# Patient Record
Sex: Male | Born: 1955
Health system: Southern US, Community
[De-identification: ages and names within clinical notes are randomized; demographics above are authoritative.]

## PROBLEM LIST (undated history)

## (undated) DIAGNOSIS — K219 Gastro-esophageal reflux disease without esophagitis: Secondary | ICD-10-CM

---

## 2012-09-21 ENCOUNTER — Emergency Department (HOSPITAL_COMMUNITY): Payer: 59

## 2012-09-21 ENCOUNTER — Encounter (HOSPITAL_COMMUNITY): Payer: Self-pay | Admitting: Emergency Medicine

## 2012-09-21 ENCOUNTER — Emergency Department (HOSPITAL_COMMUNITY)
Admission: EM | Admit: 2012-09-21 | Discharge: 2012-09-21 | Disposition: A | Discharge status: Home / Self Care | Payer: 59 | Attending: Emergency Medicine | Admitting: Emergency Medicine

## 2012-09-21 DIAGNOSIS — N200 Calculus of kidney: Secondary | ICD-10-CM | POA: Insufficient documentation

## 2012-09-21 DIAGNOSIS — K8020 Calculus of gallbladder without cholecystitis without obstruction: Secondary | ICD-10-CM | POA: Insufficient documentation

## 2012-09-21 DIAGNOSIS — K8050 Calculus of bile duct without cholangitis or cholecystitis without obstruction: Secondary | ICD-10-CM

## 2012-09-21 LAB — CBC WITH DIFFERENTIAL/PLATELET
Basophils Absolute: 0.1 10*3/uL (ref 0.0–0.1)
Eosinophils Relative: 3 % (ref 0–5)
HCT: 46.8 % (ref 39.0–52.0)
Lymphocytes Relative: 32 % (ref 12–46)
MCV: 91.1 fL (ref 78.0–100.0)
Monocytes Absolute: 0.8 10*3/uL (ref 0.1–1.0)
Monocytes Relative: 10 % (ref 3–12)
RDW: 12.1 % (ref 11.5–15.5)
WBC: 7.9 10*3/uL (ref 4.0–10.5)

## 2012-09-21 LAB — POCT I-STAT TROPONIN I: Troponin i, poc: 0.01 ng/mL (ref 0.00–0.08)

## 2012-09-21 LAB — COMPREHENSIVE METABOLIC PANEL
AST: 25 U/L (ref 0–37)
BUN: 10 mg/dL (ref 6–23)
CO2: 25 mEq/L (ref 19–32)
Calcium: 9.7 mg/dL (ref 8.4–10.5)
Creatinine, Ser: 0.89 mg/dL (ref 0.50–1.35)
GFR calc Af Amer: >90 mL/min (ref >90)
GFR calc non Af Amer: >90 mL/min (ref >90)
Glucose, Bld: 100 mg/dL — ABNORMAL HIGH (ref 70–99)

## 2012-09-21 LAB — D-DIMER, QUANTITATIVE: D-Dimer, Quant: <0.27 ug/mL-FEU (ref 0.00–0.48)

## 2012-09-21 NOTE — ED Notes (Signed)
Pt discharged.Vital signs stable and GCS 15 

## 2012-09-21 NOTE — ED Provider Notes (Signed)
Medical screening examination/treatment/procedure(s) were performed by non-physician practitioner and as supervising physician I was immediately available for consultation/collaboration.  Jasmine Awe, MD 09/21/12 [phone]

## 2012-09-21 NOTE — ED Provider Notes (Signed)
History     CSN: [id number]  Arrival date & time 09/21/12  0123   First MD Initiated Contact with Patient 09/21/12 0226      Chief Complaint  Patient presents with  . Chest Pain    (Consider location/radiation/quality/duration/timing/severity/associated sxs/prior treatment) HPI Comments: Patient is with a leg, down in bed tonight.  He was having some light chest pain.  That was worse when he was lying back.  He states it feels, like he can't get a full lung worth of pain.  Denies any nausea, recent trips has persistent chronic swelling in his right leg.  Due to previous vein stripping years ago.  Patient states he ate a fatty hamburger for dinner and this pain started several hours later.  Has no previous history of gallbladder disease has a remote history of a rib fracture on the right.  Has not taken any over-the-counter medication on a daily basis.  He did take ibuprofen prior to arrival.  Is not requesting any pain control at this time  Patient is a 56 y.o. male presenting with chest pain. The history is provided by the patient.  Chest Pain The chest pain began 3 - 5 hours ago. Chest pain occurs constantly. The chest pain is unchanged. The pain is associated with breathing. At its most intense, the pain is at 5/10. The quality of the pain is described as aching. The pain does not radiate. Pertinent negatives for primary symptoms include no fever, no cough, no abdominal pain, no nausea, no vomiting and no dizziness.  Pertinent negatives for associated symptoms include no numbness and no weakness.     History reviewed. No pertinent past medical history.  History reviewed. No pertinent past surgical history.  No family history on file.  History  Substance Use Topics  . Smoking status: Never Smoker   . Smokeless tobacco: Not on file  . Alcohol Use: Yes      Review of Systems  Constitutional: Negative for fever and chills.  HENT: Negative for congestion, rhinorrhea and  trouble swallowing.   Respiratory: Negative for cough.   Cardiovascular: Positive for chest pain.  Gastrointestinal: Negative for nausea, vomiting and abdominal pain.  Genitourinary: Negative for dysuria and urgency.  Neurological: Negative for dizziness, weakness and numbness.    Allergies  Codeine  Home Medications   Current Outpatient Rx  Name  Route  Sig  Dispense  Refill  . IBUPROFEN-DIPHENHYDRAMINE HCL 200-25 MG PO CAPS   Oral   Take 2 capsules by mouth at bedtime as needed. For sleep           BP 174/99  Pulse 80  Temp 97.1 F (36.2 C) (Oral)  Resp 16  SpO2 95%  Physical Exam  Nursing note and vitals reviewed. Constitutional: He appears well-developed and well-nourished.  HENT:  Head: Normocephalic.  Eyes: Pupils are equal, round, and reactive to light.  Neck: Normal range of motion.  Cardiovascular: Normal rate and regular rhythm.   Pulmonary/Chest: Effort normal. No respiratory distress. He exhibits tenderness.  Abdominal: Soft. Bowel sounds are normal. There is no splenomegaly or hepatomegaly. There is tenderness in the right upper quadrant. There is no CVA tenderness.      ED Course  Procedures (including critical care time)   Labs Reviewed  CBC WITH DIFFERENTIAL  POCT I-STAT TROPONIN I  COMPREHENSIVE METABOLIC PANEL   Dg Chest 2 View  09/21/2012  *RADIOLOGY REPORT*  Clinical Data: Right-sided chest pain.  CHEST - 2 VIEW  Comparison: None.  Findings: The heart size and pulmonary vascularity are normal. The lungs appear clear and expanded without focal air space disease or consolidation. No blunting of the costophrenic angles.  No pneumothorax.  Mediastinal contours appear intact.  Degenerative changes in the thoracic spine.  IMPRESSION: No evidence of active pulmonary disease.   Original Report Authenticated By: Burman Nieves, M.D.      No diagnosis found.   ED ECG REPORT   Date: 09/21/2012  EKG Time: 6:08 AM  Rate: 71  Rhythm: normal  sinus rhythm,  unchanged from previous tracings  Axis: normal  Intervals:none  ST&T Change: none  Narrative Interpretation: normal            MDM   I discussed lab results.  Ultrasound, results EKG, with patient will obtain a second cardiac marker.  If this is again, negative.  Will discharge patient home with a diagnosis of biliary colic and right kidney stone        Arman Filter, NP 09/21/12 0602  Arman Filter, NP 09/21/12 [phone]

## 2012-09-21 NOTE — ED Notes (Signed)
Pt presented to ED with pain in the rt side and SOB.

## 2012-09-21 NOTE — ED Notes (Signed)
PT. REPORTS RIGHT CHEST AND RIGHT UPPER ABDOMINAL PAIN WITH SOB WHEN LYING ON BED THIS EVENING , DENIES NAUSEA OR DIAPHORESIS , TOOK ADVIL FOR PAIN WITH NO RELIEF.

## 2013-07-05 ENCOUNTER — Other Ambulatory Visit: Payer: Self-pay | Admitting: Internal Medicine

## 2013-07-05 DIAGNOSIS — R1013 Epigastric pain: Secondary | ICD-10-CM

## 2013-07-05 DIAGNOSIS — K219 Gastro-esophageal reflux disease without esophagitis: Secondary | ICD-10-CM

## 2013-07-10 ENCOUNTER — Emergency Department (HOSPITAL_COMMUNITY): Payer: 59

## 2013-07-10 ENCOUNTER — Emergency Department (HOSPITAL_COMMUNITY)
Admission: EM | Admit: 2013-07-10 | Discharge: 2013-07-11 | Disposition: A | Discharge status: Home / Self Care | Payer: 59 | Attending: Emergency Medicine | Admitting: Emergency Medicine

## 2013-07-10 ENCOUNTER — Encounter (HOSPITAL_COMMUNITY): Payer: Self-pay | Admitting: *Deleted

## 2013-07-10 DIAGNOSIS — F411 Generalized anxiety disorder: Secondary | ICD-10-CM | POA: Insufficient documentation

## 2013-07-10 DIAGNOSIS — R1011 Right upper quadrant pain: Secondary | ICD-10-CM | POA: Insufficient documentation

## 2013-07-10 DIAGNOSIS — R0781 Pleurodynia: Secondary | ICD-10-CM

## 2013-07-10 DIAGNOSIS — M546 Pain in thoracic spine: Secondary | ICD-10-CM | POA: Insufficient documentation

## 2013-07-10 DIAGNOSIS — R0789 Other chest pain: Secondary | ICD-10-CM | POA: Insufficient documentation

## 2013-07-10 DIAGNOSIS — R61 Generalized hyperhidrosis: Secondary | ICD-10-CM | POA: Insufficient documentation

## 2013-07-10 DIAGNOSIS — Z8719 Personal history of other diseases of the digestive system: Secondary | ICD-10-CM | POA: Insufficient documentation

## 2013-07-10 HISTORY — DX: Gastro-esophageal reflux disease without esophagitis: K21.9

## 2013-07-10 LAB — D-DIMER, QUANTITATIVE: D-Dimer, Quant: <0.27 ug/mL-FEU (ref 0.00–0.48)

## 2013-07-10 LAB — CBC WITH DIFFERENTIAL/PLATELET
Basophils Absolute: 0 10*3/uL (ref 0.0–0.1)
Eosinophils Absolute: 0 10*3/uL (ref 0.0–0.7)
Eosinophils Relative: 0 % (ref 0–5)
MCH: 31.6 pg (ref 26.0–34.0)
MCHC: 34.8 g/dL (ref 30.0–36.0)
MCV: 90.8 fL (ref 78.0–100.0)
Monocytes Absolute: 0.7 10*3/uL (ref 0.1–1.0)
Platelets: 167 10*3/uL (ref 150–400)
RDW: 12 % (ref 11.5–15.5)

## 2013-07-10 LAB — COMPREHENSIVE METABOLIC PANEL
ALT: 23 U/L (ref 0–53)
AST: 28 U/L (ref 0–37)
Calcium: 9.2 mg/dL (ref 8.4–10.5)
Creatinine, Ser: 1.04 mg/dL (ref 0.50–1.35)
GFR calc Af Amer: >90 mL/min (ref >90)
Glucose, Bld: 122 mg/dL — ABNORMAL HIGH (ref 70–99)
Sodium: 140 mEq/L (ref 135–145)
Total Protein: 7.2 g/dL (ref 6.0–8.3)

## 2013-07-10 MED ORDER — IBUPROFEN 600 MG PO TABS: 600.0000 mg | ORAL_TABLET | Freq: Three times a day (TID) | ORAL | Status: AC

## 2013-07-10 NOTE — ED Notes (Signed)
MD at bedside. 

## 2013-07-10 NOTE — ED Notes (Signed)
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 Pain starts underneath his ribs, radiates to back. Pt states he feels like he can't get a deep breath in. Pt reports pain for the past three or four weeks. Pt states pain increases with deep inspiration. Pt is A&Ox4, respirations equal and unlabored, skin warm and dry.

## 2013-07-10 NOTE — ED Notes (Signed)
Family at bedside. 

## 2013-07-10 NOTE — ED Provider Notes (Signed)
CSN: [id number]     Arrival date & time 07/10/13  2027 History   First MD Initiated Contact with Patient 07/10/13 2029     Chief Complaint  Patient presents with  . Shortness of Breath  . Abdominal Pain   (Consider location/radiation/quality/duration/timing/severity/associated sxs/prior Treatment) HPI Patient presents with right upper quadrant pain that radiates to his right thoracic back. This pain has been intermittent for quite some time. He was seen 2 years ago in emergency department for similar complaints. The pain is worse after eating. The pain is worse with deep breathing. States he feels he can't take a deep breath in. He's had no recent trauma to the chest or increased heavy lifting. He said no coughing. He denies any nausea, vomiting or diarrhea. He was told the past he had sludge in his gallbladder. The pain worsened tonight. Patient became diaphoretic and his blood pressure was taken and found to be elevated. Patient continues to have a right upper quadrant right chest pain that radiates to his back. No recent lower extremity swelling or pain. Past Medical History  Diagnosis Date  . GERD (gastroesophageal reflux disease)    History reviewed. No pertinent past surgical history. History reviewed. No pertinent family history. History  Substance Use Topics  . Smoking status: Never Smoker   . Smokeless tobacco: Not on file  . Alcohol Use: Yes    Review of Systems  Constitutional: Positive for diaphoresis. Negative for fever and chills.  Respiratory: Positive for shortness of breath. Negative for cough and wheezing.   Cardiovascular: Positive for chest pain. Negative for palpitations and leg swelling.  Gastrointestinal: Positive for abdominal pain. Negative for nausea, vomiting, diarrhea and constipation.  Genitourinary: Negative for dysuria and flank pain.  Musculoskeletal: Positive for back pain. Negative for myalgias.  Skin: Negative for rash and wound.  Neurological:  Negative for dizziness, weakness, light-headedness, numbness and headaches.  All other systems reviewed and are negative.    Allergies  Codeine  Home Medications   Current Outpatient Rx  Name  Route  Sig  Dispense  Refill  . Ibuprofen-Diphenhydramine HCl (ADVIL PM) 200-25 MG CAPS   Oral   Take 2 capsules by mouth at bedtime as needed. For sleep          BP 148/107  Pulse 71  Temp(Src) 97.6 F (36.4 C) (Oral)  Resp 15  Ht 6' (1.829 m)  Wt 202 lb (91.627 kg)  BMI 27.39 kg/m2  SpO2 100% Physical Exam  Nursing note and vitals reviewed. Constitutional: He is oriented to person, place, and time. He appears well-developed and well-nourished. No distress.  Anxious appearing  HENT:  Head: Normocephalic and atraumatic.  Mouth/Throat: Oropharynx is clear and moist. No oropharyngeal exudate.  Eyes: EOM are normal. Pupils are equal, round, and reactive to light.  Neck: Normal range of motion. Neck supple.  Cardiovascular: Normal rate and regular rhythm.  Exam reveals no gallop and no friction rub.   No murmur heard. Pulmonary/Chest: Effort normal and breath sounds normal. No respiratory distress. He has no wheezes. He has no rales. He exhibits no tenderness.  Abdominal: Soft. Bowel sounds are normal. He exhibits no distension and no mass. There is tenderness (multilayers to palpation in his right upper quadrant). There is no rebound and no guarding.  Musculoskeletal: Normal range of motion. He exhibits no edema and no tenderness.  No calf swelling or tenderness.  Neurological: He is alert and oriented to person, place, and time.  Patient is alert and oriented  x3 with clear, goal oriented speech. Patient has 5/5 motor in all extremities. Sensation is intact to light touch.    Skin: Skin is warm and dry. No rash noted. No erythema.  Psychiatric: He has a normal mood and affect. His behavior is normal.    ED Course  Procedures (including critical care time) Labs Review Labs  Reviewed  CBC WITH DIFFERENTIAL  COMPREHENSIVE METABOLIC PANEL  TROPONIN I  LIPASE, BLOOD  URINALYSIS, ROUTINE W REFLEX MICROSCOPIC  D-DIMER, QUANTITATIVE   Imaging Review No results found.  Date: 07/10/2013  Rate:72  Rhythm: normal sinus rhythm  QRS Axis: normal  Intervals: normal  ST/T Wave abnormalities: normal  Conduction Disutrbances:none  Narrative Interpretation:   Old EKG Reviewed: When compared with prior EKGs no significant changes are seen   MDM   Patient's symptoms have improved. His vital signs remained stable. His labs are normal. His ocean shows a normal gallbladder. The symptoms are likely due to pleurodynia. Flow started anti-inflammatory medication. Patient is advised to return for worsening shortness of breath, chest pain, fevers or chills, any concerns.  Loren Racer, MD 07/14/13 [phone]

## 2013-07-10 NOTE — ED Notes (Signed)
Lab at bedside

## 2013-07-14 ENCOUNTER — Other Ambulatory Visit: Payer: Self-pay | Admitting: Internal Medicine

## 2013-07-14 ENCOUNTER — Ambulatory Visit
Admission: RE | Admit: 2013-07-14 | Discharge: 2013-07-14 | Disposition: A | Discharge status: Home / Self Care | Payer: 59 | Source: Ambulatory Visit | Attending: Internal Medicine | Admitting: Internal Medicine

## 2013-07-14 DIAGNOSIS — K219 Gastro-esophageal reflux disease without esophagitis: Secondary | ICD-10-CM

## 2013-07-14 DIAGNOSIS — R1013 Epigastric pain: Secondary | ICD-10-CM

## 2015-05-21 ENCOUNTER — Other Ambulatory Visit: Payer: Self-pay | Admitting: Occupational Medicine

## 2015-05-21 ENCOUNTER — Ambulatory Visit
Admission: RE | Admit: 2015-05-21 | Discharge: 2015-05-21 | Disposition: A | Discharge status: Home / Self Care | Payer: No Typology Code available for payment source | Source: Ambulatory Visit | Attending: Occupational Medicine | Admitting: Occupational Medicine

## 2015-05-21 DIAGNOSIS — Z139 Encounter for screening, unspecified: Secondary | ICD-10-CM

## 2018-01-20 ENCOUNTER — Other Ambulatory Visit: Payer: Self-pay | Admitting: Internal Medicine

## 2018-01-20 DIAGNOSIS — K4090 Unilateral inguinal hernia, without obstruction or gangrene, not specified as recurrent: Secondary | ICD-10-CM | POA: Diagnosis not present

## 2018-01-20 DIAGNOSIS — R109 Unspecified abdominal pain: Secondary | ICD-10-CM | POA: Diagnosis not present

## 2018-01-27 ENCOUNTER — Ambulatory Visit
Admission: RE | Admit: 2018-01-27 | Discharge: 2018-01-27 | Disposition: A | Discharge status: Home / Self Care | Payer: 59 | Source: Ambulatory Visit | Attending: Internal Medicine | Admitting: Internal Medicine

## 2018-01-27 DIAGNOSIS — R109 Unspecified abdominal pain: Secondary | ICD-10-CM

## 2018-01-27 DIAGNOSIS — R1011 Right upper quadrant pain: Secondary | ICD-10-CM | POA: Diagnosis not present

## 2018-01-27 DIAGNOSIS — R10A0 Flank pain, unspecified side: Secondary | ICD-10-CM

## 2018-09-27 ENCOUNTER — Emergency Department (HOSPITAL_COMMUNITY)
Admission: EM | Admit: 2018-09-27 | Discharge: 2018-09-28 | Disposition: A | Discharge status: Home / Self Care | Payer: 59 | Attending: Emergency Medicine | Admitting: Emergency Medicine

## 2018-09-27 ENCOUNTER — Encounter (HOSPITAL_COMMUNITY): Payer: Self-pay

## 2018-09-27 ENCOUNTER — Emergency Department (HOSPITAL_COMMUNITY): Payer: 59

## 2018-09-27 ENCOUNTER — Other Ambulatory Visit: Payer: Self-pay

## 2018-09-27 DIAGNOSIS — R29818 Other symptoms and signs involving the nervous system: Secondary | ICD-10-CM | POA: Diagnosis not present

## 2018-09-27 DIAGNOSIS — R079 Chest pain, unspecified: Secondary | ICD-10-CM | POA: Diagnosis not present

## 2018-09-27 DIAGNOSIS — R42 Dizziness and giddiness: Secondary | ICD-10-CM | POA: Diagnosis not present

## 2018-09-27 DIAGNOSIS — I10 Essential (primary) hypertension: Secondary | ICD-10-CM | POA: Diagnosis not present

## 2018-09-27 LAB — DIFFERENTIAL
Abs Immature Granulocytes: 0.03 10*3/uL (ref 0.00–0.07)
BASOS ABS: 0.1 10*3/uL (ref 0.0–0.1)
Basophils Relative: 1 %
Eosinophils Absolute: 0.1 10*3/uL (ref 0.0–0.5)
Eosinophils Relative: 2 %
Immature Granulocytes: 0 %
Lymphocytes Relative: 19 %
Lymphs Abs: 1.5 10*3/uL (ref 0.7–4.0)
Monocytes Absolute: 0.7 10*3/uL (ref 0.1–1.0)
Monocytes Relative: 8 %
Neutro Abs: 5.5 10*3/uL (ref 1.7–7.7)
Neutrophils Relative %: 70 %

## 2018-09-27 LAB — CBC
HCT: 46.8 % (ref 39.0–52.0)
HEMOGLOBIN: 15.4 g/dL (ref 13.0–17.0)
MCH: 31.3 pg (ref 26.0–34.0)
MCHC: 32.9 g/dL (ref 30.0–36.0)
MCV: 95.1 fL (ref 80.0–100.0)
Platelets: 175 10*3/uL (ref 150–400)
RBC: 4.92 MIL/uL (ref 4.22–5.81)
RDW: 11.6 % (ref 11.5–15.5)
WBC: 7.9 10*3/uL (ref 4.0–10.5)
nRBC: 0 % (ref 0.0–0.2)

## 2018-09-27 LAB — COMPREHENSIVE METABOLIC PANEL
ALT: 37 U/L (ref 0–44)
AST: 32 U/L (ref 15–41)
Albumin: 4.3 g/dL (ref 3.5–5.0)
Alkaline Phosphatase: 40 U/L (ref 38–126)
Anion gap: 11 (ref 5–15)
BUN: 11 mg/dL (ref 8–23)
CO2: 26 mmol/L (ref 22–32)
Calcium: 9.2 mg/dL (ref 8.9–10.3)
Chloride: 103 mmol/L (ref 98–111)
Creatinine, Ser: 1.02 mg/dL (ref 0.61–1.24)
GFR calc non Af Amer: >60 mL/min (ref >60)
Glucose, Bld: 106 mg/dL — ABNORMAL HIGH (ref 70–99)
Potassium: 3.8 mmol/L (ref 3.5–5.1)
SODIUM: 140 mmol/L (ref 135–145)
Total Bilirubin: 1.2 mg/dL (ref 0.3–1.2)
Total Protein: 7.2 g/dL (ref 6.5–8.1)

## 2018-09-27 LAB — PROTIME-INR
INR: 0.94
Prothrombin Time: 12.5 seconds (ref 11.4–15.2)

## 2018-09-27 LAB — CBG MONITORING, ED: Glucose-Capillary: 108 mg/dL — ABNORMAL HIGH (ref 70–99)

## 2018-09-27 LAB — I-STAT TROPONIN, ED: Troponin i, poc: 0.01 ng/mL (ref 0.00–0.08)

## 2018-09-27 LAB — APTT: aPTT: 29 seconds (ref 24–36)

## 2018-09-27 NOTE — ED Provider Notes (Signed)
Emergency Department Provider Note   I have reviewed the triage vital signs and the nursing notes.   HISTORY  Chief Complaint Dizziness and Hypertension   HPI Zachary Scott is a 62 y.o. male without any past medical history that presents to the emergency department today with hypertension.  Patient states that he drank a lot of beer yesterday while watching football.  He states he drink much more than he normally would.  Today he started feel little bit dizzy and some pressure in his head so he checked his blood pressure and it was 188 systolic so he sat down to relax and watch TV started having some tingling in both of his hands and rechecked his blood pressure was 210 systolic so presents here for further evaluation.  During this episode he did have one episode of chest pain but it was fleeting and not associated shortness of breath, nausea vomiting or diaphoresis.  Patient has no lower extremity swelling.  No change in urination.  States that he drinks more days any does not but normally does not drink a lot.  Last time he has blood pressure checked was about a month ago and it was 120 or 130 systolic.  No drugs.  No history of tobacco.  On review of his systems patient states his extremities often fall asleep at night and then they improve once he wakes up he has no paresthesias, numbness or weakness at this time. No other associated or modifying symptoms.    Past Medical History:  Diagnosis Date  . GERD (gastroesophageal reflux disease)     There are no active problems to display for this patient.   No past surgical history on file.  Current Outpatient Rx  . Order #: [id number] Class: Print    Allergies Codeine  No family history on file.  Social History Social History   Tobacco Use  . Smoking status: Never Smoker  Substance Use Topics  . Alcohol use: Yes  . Drug use: No    Review of Systems  All other systems negative except as documented in the HPI. All  pertinent positives and negatives as reviewed in the HPI. ____________________________________________   PHYSICAL EXAM:  VITAL SIGNS: ED Triage Vitals  Enc Vitals Group     BP 09/27/18 2145 (!) 188/126     Pulse Rate 09/27/18 2145 89     Resp 09/27/18 2145 16     Temp 09/27/18 2145 97.9 F (36.6 C)     Temp Source 09/27/18 2145 Oral     SpO2 09/27/18 2145 96 %    Constitutional: Alert and oriented. Well appearing and in no acute distress. Eyes: Conjunctivae are normal. PERRL. EOMI. Head: Atraumatic. Nose: No congestion/rhinnorhea. Mouth/Throat: Mucous membranes are moist.  Oropharynx non-erythematous. Neck: No stridor.  No meningeal signs.   Cardiovascular: Normal rate, regular rhythm. Good peripheral circulation. Grossly normal heart sounds.   Respiratory: Normal respiratory effort.  No retractions. Lungs CTAB. Gastrointestinal: Soft and nontender. No distention.  Musculoskeletal: No lower extremity tenderness nor edema. No gross deformities of extremities. Neurologic:  Mild tremor present on outstretched hands Face is symmetric, EOM's intact, pupils equal and reactive, vision intact, tongue and uvula midline without deviation. Upper and Lower extremity motor 5/5, intact pain perception in distal extremities, 2+ reflexes in biceps, patella and achilles tendons. Able to perform finger to nose normal with both hands. Walks without assistance or evident ataxia.  Skin:  Skin is warm, dry and intact. No rash noted.  ____________________________________________   LABS (all labs ordered are listed, but only abnormal results are displayed)  Labs Reviewed  COMPREHENSIVE METABOLIC PANEL - Abnormal; Notable for the following components:      Result Value   Glucose, Bld 106 (*)    All other components within normal limits  CBG MONITORING, ED - Abnormal; Notable for the following components:   Glucose-Capillary 108 (*)    All other components within normal limits  PROTIME-INR    APTT  CBC  DIFFERENTIAL  TROPONIN I  I-STAT TROPONIN, ED   ____________________________________________  EKG   EKG Interpretation  Date/Time:  Tuesday September 27 2018 23:33:32 EST Ventricular Rate:  67 PR Interval:    QRS Duration: 92 QT Interval:  432 QTC Calculation: 457 R Axis:   45 Text Interpretation:  Sinus rhythm Anteroseptal infarct, age indeterminate Minimal ST elevation, inferior leads Baseline wander in lead(s) V2 No significant change since last tracing in 2014 Confirmed by Marily Memos [phone]) on 09/27/2018 11:48:19 PM      ____________________________________________  RADIOLOGY  Dg Chest 2 View  Result Date: 09/28/2018 CLINICAL DATA:  Chest pain. EXAM: CHEST - 2 VIEW COMPARISON:  Chest x-ray dated May 21, 2015. FINDINGS: The heart size and mediastinal contours are within normal limits. Both lungs are clear. The visualized skeletal structures are unremarkable. IMPRESSION: No active cardiopulmonary disease. Electronically Signed   By: Obie Dredge M.D.   On: 09/28/2018 00:31   Ct Head Wo Contrast  Result Date: 09/27/2018 CLINICAL DATA:  Focal neuro deficit, < 6 hrs, stroke suspected. Dizziness with numbness and tingling in both hands. Elevated blood pressure. EXAM: CT HEAD WITHOUT CONTRAST TECHNIQUE: Contiguous axial images were obtained from the base of the skull through the vertex without intravenous contrast. COMPARISON:  None. FINDINGS: Brain: Brain volume is normal for age. No intracranial hemorrhage, mass effect, or midline shift. No hydrocephalus. The basilar cisterns are patent. No evidence of territorial infarct or acute ischemia. No extra-axial or intracranial fluid collection. Vascular: No hyperdense vessel or unexpected calcification. Skull: No fracture or focal lesion. Sinuses/Orbits: Opacification of a single right ethmoid air cell. Mastoid air cells are clear. No mastoid effusion. Other: None. IMPRESSION: Negative head CT. Electronically Signed    By: Narda Rutherford M.D.   On: 09/27/2018 22:34    ____________________________________________   PROCEDURES  Procedure(s) performed:   Procedures   ____________________________________________   INITIAL IMPRESSION / ASSESSMENT AND PLAN / ED COURSE  Suspect patient has acute alcohol withdrawal syndrome probably caused his elevated blood pressure which is related to his symptoms.  His work-up is overall negative.  We will get a second troponin as he did have chest pain with this.  Blood pressures are improved and is asymptomatic at this time.  Will consider stopping drinking and follow-up with his doctor with a blood pressure log if his second troponin is negative.  Second trop fine. bp siginficantly improved. Will track BP's at home w/ PCP fu. Here if worsening symptoms.      Pertinent labs & imaging results that were available during my care of the patient were reviewed by me and considered in my medical decision making (see chart for details).  ____________________________________________  FINAL CLINICAL IMPRESSION(S) / ED DIAGNOSES  Final diagnoses:  Hypertension, unspecified type     MEDICATIONS GIVEN DURING THIS VISIT:  Medications - No data to display   NEW OUTPATIENT MEDICATIONS STARTED DURING THIS VISIT:  Discharge Medication List as of 09/28/2018  2:21 AM  Note:  This note was prepared with assistance of Dragon voice recognition software. Occasional wrong-word or sound-a-like substitutions may have occurred due to the inherent limitations of voice recognition software.   Shakayla Hickox, Barbara Cower, MD 09/28/18 [phone]

## 2018-09-27 NOTE — ED Notes (Signed)
ED Provider at bedside. 

## 2018-09-27 NOTE — ED Triage Notes (Signed)
Pt reports around 4pm tonight he started to feel dizzy, checked his BP and it was high then started having tingling in both his hands. Pt also states he feels like something stuck in his chest but denies pain. Pt anxious in triage.

## 2018-09-28 ENCOUNTER — Emergency Department (HOSPITAL_COMMUNITY): Payer: 59

## 2018-09-28 DIAGNOSIS — R079 Chest pain, unspecified: Secondary | ICD-10-CM | POA: Diagnosis not present

## 2018-09-28 LAB — TROPONIN I: Troponin I: <0.03 ng/mL (ref <0.03)

## 2018-10-17 DIAGNOSIS — I10 Essential (primary) hypertension: Secondary | ICD-10-CM | POA: Diagnosis not present

## 2019-01-10 DIAGNOSIS — J029 Acute pharyngitis, unspecified: Secondary | ICD-10-CM | POA: Diagnosis not present

## 2019-01-31 DIAGNOSIS — I10 Essential (primary) hypertension: Secondary | ICD-10-CM | POA: Diagnosis not present

## 2019-01-31 DIAGNOSIS — J302 Other seasonal allergic rhinitis: Secondary | ICD-10-CM | POA: Diagnosis not present

## 2021-11-04 ENCOUNTER — Other Ambulatory Visit: Payer: Self-pay | Admitting: Gastroenterology

## 2021-11-04 DIAGNOSIS — Z86010 Personal history of colonic polyps: Secondary | ICD-10-CM

## 2021-12-08 ENCOUNTER — Ambulatory Visit
Admission: RE | Admit: 2021-12-08 | Discharge: 2021-12-08 | Disposition: A | Discharge status: Home / Self Care | Payer: 59 | Source: Ambulatory Visit | Attending: Gastroenterology | Admitting: Gastroenterology

## 2021-12-08 DIAGNOSIS — Z86010 Personal history of colonic polyps: Secondary | ICD-10-CM

## 2022-11-07 ENCOUNTER — Emergency Department (HOSPITAL_BASED_OUTPATIENT_CLINIC_OR_DEPARTMENT_OTHER)
Admission: EM | Admit: 2022-11-07 | Discharge: 2022-11-07 | Disposition: A | Discharge status: Home / Self Care | Payer: Medicare HMO | Attending: Emergency Medicine | Admitting: Emergency Medicine

## 2022-11-07 ENCOUNTER — Encounter (HOSPITAL_BASED_OUTPATIENT_CLINIC_OR_DEPARTMENT_OTHER): Payer: Self-pay | Admitting: Emergency Medicine

## 2022-11-07 ENCOUNTER — Other Ambulatory Visit: Payer: Self-pay

## 2022-11-07 DIAGNOSIS — W228XXA Striking against or struck by other objects, initial encounter: Secondary | ICD-10-CM | POA: Insufficient documentation

## 2022-11-07 DIAGNOSIS — S61012A Laceration without foreign body of left thumb without damage to nail, initial encounter: Secondary | ICD-10-CM | POA: Diagnosis not present

## 2022-11-07 DIAGNOSIS — S6992XA Unspecified injury of left wrist, hand and finger(s), initial encounter: Secondary | ICD-10-CM | POA: Diagnosis present

## 2022-11-07 DIAGNOSIS — Y9389 Activity, other specified: Secondary | ICD-10-CM | POA: Diagnosis not present

## 2022-11-07 MED ORDER — CEPHALEXIN 500 MG PO CAPS: 500.0000 mg | ORAL_CAPSULE | Freq: Four times a day (QID) | ORAL | 0 refills | Status: AC

## 2022-11-07 MED ORDER — LIDOCAINE HCL 2 % IJ SOLN
10.0000 mL | Freq: Once | INTRAMUSCULAR | Status: DC
  Filled 2022-11-07: qty 20

## 2022-11-07 NOTE — Discharge Instructions (Addendum)
Your history, exam, workup today were consistent with laceration to the left thumb base.  You were nontender and we agreed to hold on x-ray as we do not see any evidence of foreign body either.  Your exam was reassuring with no numbness, tingling, or weakness.  You did have a piece of tissue that was lost during the injury but were able to pull it together well.  The sutures in place are nonabsorbable so you need to have it removed in approximately 10 days with your primary doctor.  Please keep the splint in place and try not to tear the sutures.  Please use the antibiotic to prevent infection and your tetanus was up-to-date.  We washed it and cleaned it thoroughly.  If any symptoms change or worsen acutely, please follow-up in the nearest emergency department.  Please also consider follow-up with the hand doctor.

## 2022-11-07 NOTE — ED Provider Notes (Signed)
[facility] Provider Note   CSN: [id number] Arrival date & time: 11/07/22  1832     History  Chief Complaint  Patient presents with   Hand Injury    Zachary Scott is a 67 y.o. male.  The history is provided by the patient and medical records. No language interpreter was used.  Hand Injury Location:  Hand Hand location:  L palm Injury: yes   Time since incident:  5 hours Mechanism of injury comment:  Hit by tool while changing valve stem on a mower tire Pain details:    Quality:  Aching   Severity:  Mild   Onset quality:  Sudden   Progression:  Resolved Handedness:  Right-handed Dislocation: no   Foreign body present:  No foreign bodies Tetanus status:  Up to date Prior injury to area:  No Relieved by:  Nothing Worsened by:  Nothing Ineffective treatments:  None tried Associated symptoms: no back pain, no decreased range of motion, no fatigue, no fever, no muscle weakness, no numbness and no stiffness        Home Medications Prior to Admission medications   Medication Sig Start Date End Date Taking? Authorizing Provider  ibuprofen (ADVIL,MOTRIN) 600 MG tablet Take 1 tablet (600 mg total) by mouth 3 (three) times daily after meals. Patient not taking: Reported on 09/28/2018 07/10/13   Julianne Rice, MD      Allergies    Codeine    Review of Systems   Review of Systems  Constitutional:  Negative for chills, fatigue and fever.  Respiratory:  Negative for cough.   Cardiovascular:  Negative for chest pain.  Gastrointestinal:  Negative for abdominal pain.  Musculoskeletal:  Negative for back pain and stiffness.  Skin:  Positive for wound.  Neurological:  Negative for headaches.  All other systems reviewed and are negative.   Physical Exam Updated Vital Signs BP (!) 154/90 (BP Location: Right Arm)   Pulse 76   Temp 98 F (36.7 C) (Oral)   Resp 18   SpO2 97%  Physical Exam Vitals and nursing note reviewed.   Constitutional:      General: He is not in acute distress.    Appearance: He is well-developed. He is not ill-appearing, toxic-appearing or diaphoretic.  HENT:     Head: Normocephalic and atraumatic.  Eyes:     Conjunctiva/sclera: Conjunctivae normal.  Cardiovascular:     Rate and Rhythm: Normal rate and regular rhythm.     Heart sounds: No murmur heard. Pulmonary:     Effort: Pulmonary effort is normal. No respiratory distress.     Breath sounds: Normal breath sounds. No wheezing, rhonchi or rales.  Chest:     Chest wall: No tenderness.  Abdominal:     Palpations: Abdomen is soft.     Tenderness: There is no abdominal tenderness. There is no guarding or rebound.  Musculoskeletal:        General: Tenderness and signs of injury present. No swelling.     Left hand: Laceration and tenderness present. No swelling, deformity or bony tenderness. Normal range of motion. Normal strength. Normal sensation. Normal capillary refill. Normal pulse.       Hands:     Cervical back: Neck supple.     Comments: 2 cm laceration to thenar eminence of left hand.  Hemostatic.  Piece of tissue appears to be missing.  No foreign body seen and full range of motion and only appears to be skin and  fatty injury.  No visible ligament or tendon injury.  No visible muscle body injury.  Intact capillary refill, sensation, and no tenderness in the snuffbox.  Can touch his thumb to all of his other fingers and has normal range of motion.  Skin:    General: Skin is warm and dry.     Capillary Refill: Capillary refill takes less than 2 seconds.  Neurological:     Mental Status: He is alert.     Sensory: No sensory deficit.     Motor: No weakness.  Psychiatric:        Mood and Affect: Mood normal.     ED Results / Procedures / Treatments   Labs (all labs ordered are listed, but only abnormal results are displayed) Labs Reviewed - No data to display  EKG None  Radiology No results  found.  Procedures .Marland KitchenLaceration Repair  Date/Time: 11/07/2022 11:19 PM  Performed by: Heide Scales, MD Authorized by: Heide Scales, MD   Consent:    Consent obtained:  Verbal   Consent given by:  Patient   Risks discussed:  Infection, pain, poor cosmetic result, poor wound healing and need for additional repair   Alternatives discussed:  No treatment Universal protocol:    Immediately prior to procedure, a time out was called: yes     Patient identity confirmed:  Verbally with patient Anesthesia:    Anesthesia method:  Local infiltration   Local anesthetic:  Lidocaine 2% w/o epi Laceration details:    Location:  Hand   Hand location:  L palm   Length (cm):  2   Depth (mm):  4 Pre-procedure details:    Preparation:  Patient was prepped and draped in usual sterile fashion Exploration:    Imaging outcome: foreign body not noted     Wound exploration: wound explored through full range of motion and entire depth of wound visualized     Wound extent: no signs of injury, no nerve damage and no tendon damage     Contaminated: no   Treatment:    Area cleansed with:  Chlorhexidine, saline and Shur-Clens   Amount of cleaning:  Extensive   Irrigation solution:  Sterile saline   Irrigation method:  Syringe   Visualized foreign bodies/material removed: no     Debridement:  None   Undermining:  None   Scar revision: no   Skin repair:    Repair method:  Sutures   Suture size:  4-0   Suture material:  Prolene   Suture technique:  Simple interrupted   Number of sutures:  7 Approximation:    Approximation:  Close Repair type:    Repair type:  Intermediate Post-procedure details:    Dressing:  Antibiotic ointment, non-adherent dressing, splint for protection and tube gauze   Procedure completion:  Tolerated     Medications Ordered in ED Medications  lidocaine (XYLOCAINE) 2 % (with pres) injection 200 mg (has no administration in time range)    ED Course/  Medical Decision Making/ A&P                             Medical Decision Making Risk Prescription drug management.    Zachary Scott is a 67 y.o. male with a past medical history significant for GERD who presents with hand laceration.  According to patient, he was replacing the valve stem on a lawnmower tire when the device he was using to hold  tension released quickly and a metal band snapped up taking a piece out of his thenar eminence on his left hand.  It is at the base of the thumb going towards the palm and is approximately 2 cm.  It appears to be 2 wide of tissue missing.  He reports significant bleeding but then that improved.  He denies any other injuries and had no preceding symptoms.  He denies numbness, tingling, or weakness in the fingers.  His tetanus is up-to-date from last year he reports.  On exam, patient can touch his thumb to all other fingers and has intact sensation and capillary refill.  He has no snuffbox tenderness whatsoever and there was no foreign body seen on extensive washing and cleaning.  He has no wrist tenderness and good pulses otherwise.  Otherwise lungs clear and chest nontender.  Patient otherwise well-appearing.  We had a shared decision-making conversation offering x-ray however given the lack of tenderness and foreign body seen we agreed to hold on imaging at this time.  Patient agrees.  Patient had his laceration repaired without difficulty with 7 nonabsorbable sutures in place.  We then used antibiotic ointment, nonhearing dressing, wrapping, and splinting so it does not rip open.  There was no evidence of deeper injury aside from some fatty tissue.  No evidence of muscle tear or other deep injury.  Patient will be started on antibiotics due to the dirty nature of his wound and will follow-up with hand surgery and PCP.  Patient is to return precautions and to watch for signs and symptoms of infection.  He had other questions or concerns and was  discharged in good condition after laceration repair.         Final Clinical Impression(s) / ED Diagnoses Final diagnoses:  Laceration of left thumb without foreign body without damage to nail, initial encounter    Rx / DC Orders ED Discharge Orders          Ordered    cephALEXin (KEFLEX) 500 MG capsule  4 times daily        11/07/22 2307            Clinical Impression: 1. Laceration of left thumb without foreign body without damage to nail, initial encounter     Disposition: Discharge  Condition: Good  I have discussed the results, Dx and Tx plan with the pt(& family if present). He/she/they expressed understanding and agree(s) with the plan. Discharge instructions discussed at great length. Strict return precautions discussed and pt &/or family have verbalized understanding of the instructions. No further questions at time of discharge.    New Prescriptions   CEPHALEXIN (KEFLEX) 500 MG CAPSULE    Take 1 capsule (500 mg total) by mouth 4 (four) times daily for 7 days.    Follow Up: [facility] [address], [place] [address] [phone]   with hand surgery      Jaylenn Baiza, Gwenyth Allegra, MD 11/07/22 [phone]

## 2022-11-07 NOTE — ED Triage Notes (Addendum)
Laceration to left palm near thumb. Able to move. Bleeding mostly controlled.  About 4cm long, pretty deep.  Cleaned and dressed in triage

## 2022-11-17 DIAGNOSIS — S61412A Laceration without foreign body of left hand, initial encounter: Secondary | ICD-10-CM | POA: Diagnosis not present

## 2023-05-03 DIAGNOSIS — R7303 Prediabetes: Secondary | ICD-10-CM | POA: Diagnosis not present

## 2023-05-03 DIAGNOSIS — I700 Atherosclerosis of aorta: Secondary | ICD-10-CM | POA: Diagnosis not present

## 2023-05-03 DIAGNOSIS — Z1389 Encounter for screening for other disorder: Secondary | ICD-10-CM | POA: Diagnosis not present

## 2023-05-03 DIAGNOSIS — Z0000 Encounter for general adult medical examination without abnormal findings: Secondary | ICD-10-CM | POA: Diagnosis not present

## 2023-05-03 DIAGNOSIS — Z23 Encounter for immunization: Secondary | ICD-10-CM | POA: Diagnosis not present

## 2023-05-03 DIAGNOSIS — K4090 Unilateral inguinal hernia, without obstruction or gangrene, not specified as recurrent: Secondary | ICD-10-CM | POA: Diagnosis not present

## 2023-05-03 DIAGNOSIS — J302 Other seasonal allergic rhinitis: Secondary | ICD-10-CM | POA: Diagnosis not present

## 2023-05-03 DIAGNOSIS — Z125 Encounter for screening for malignant neoplasm of prostate: Secondary | ICD-10-CM | POA: Diagnosis not present

## 2023-05-03 DIAGNOSIS — I10 Essential (primary) hypertension: Secondary | ICD-10-CM | POA: Diagnosis not present

## 2023-12-05 IMAGING — CT CT VIRTUAL COLONOSCOPY SCREENING
3 of 13 series · 12 of 46 positions shown, 18 images · non-contrast
Comparison: None

CLINICAL DATA: Incomplete colonoscopy.  History of polyps.

EXAM:
CT VIRTUAL COLONOSCOPY SCREENING
TECHNIQUE: The patient was given a standard bowel preparation with Gastrografin
and barium for fluid and stool tagging respectively. The quality of
the bowel preparation is good. Automated CO2 insufflation of the
colon was performed prior to image acquisition and colonic
distention is poor. Image post processing was used to generate a 3D
endoluminal fly-through projection of the colon and to
electronically subtract stool/fluid as appropriate.

[Series 8: supine colon 1.50 br40 s3 supine thins · axial · 0.82mm/px · z∈[+1281,+1628]mm · 5 of 347 slices shown, 10 images]
[im 58/347  soft-tissue]
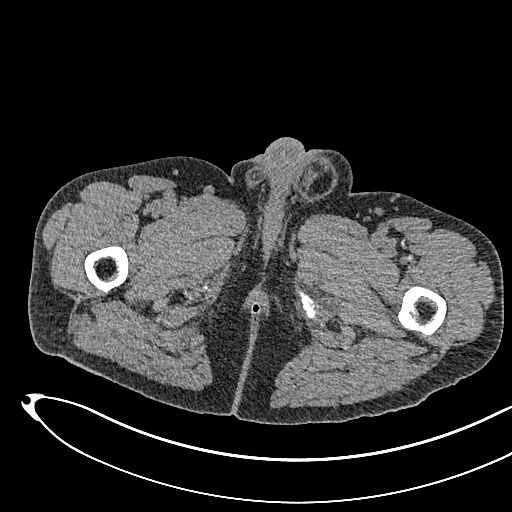
[im 58/347  bone]
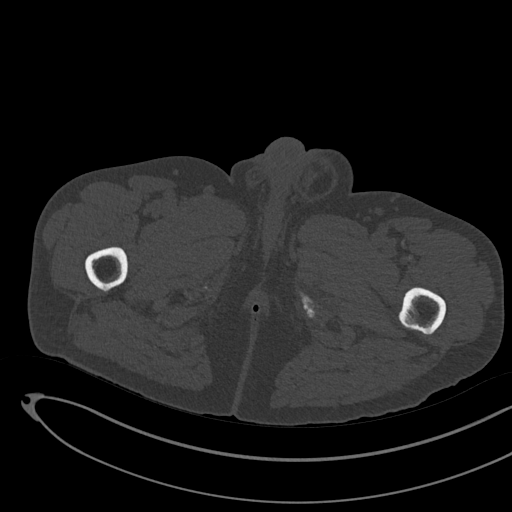
[im 116/347  soft-tissue]
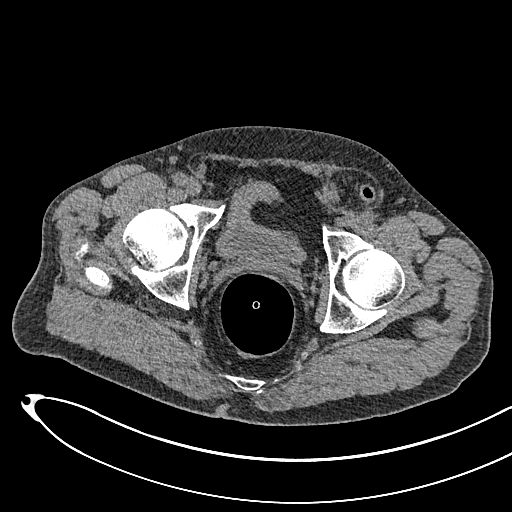
[im 116/347  lung]
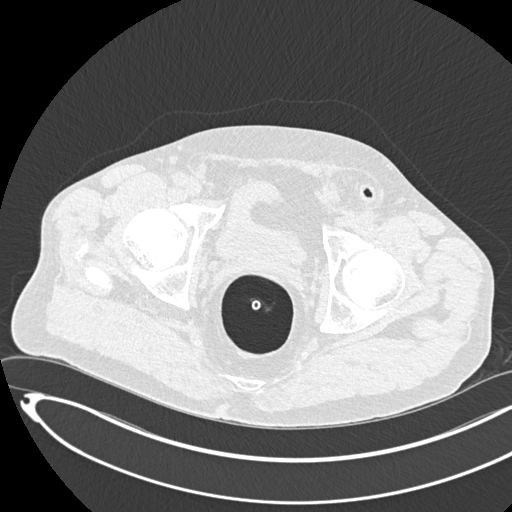
[im 174/347  soft-tissue]
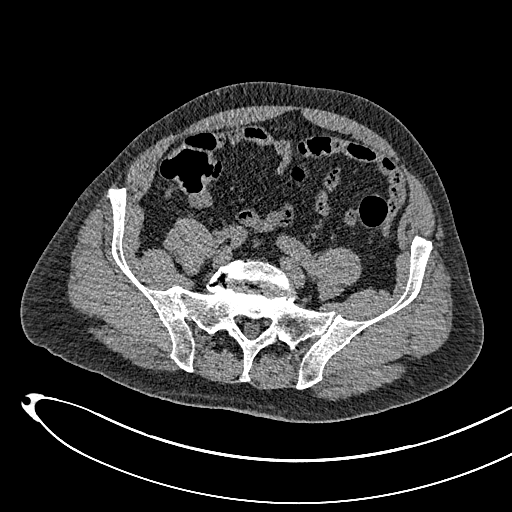
[im 174/347  lung]
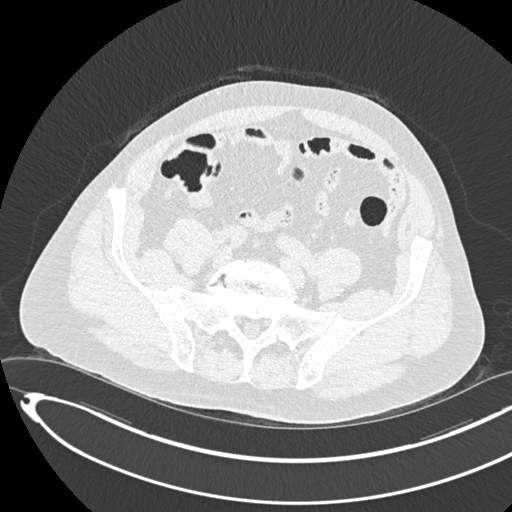
[im 231/347  soft-tissue]
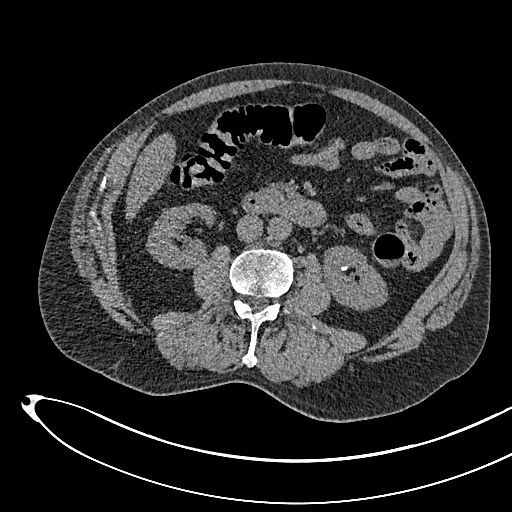
[im 231/347  lung]
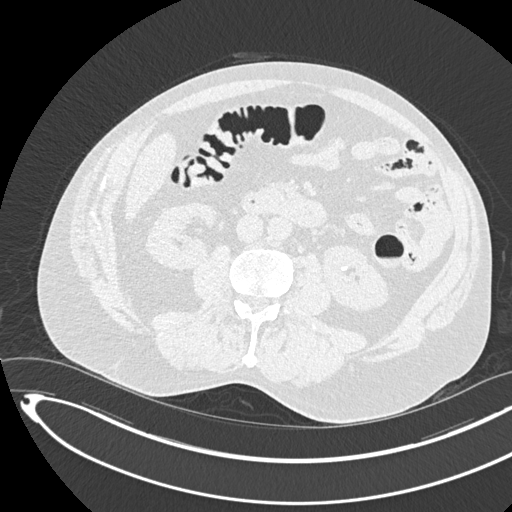
[im 289/347  soft-tissue]
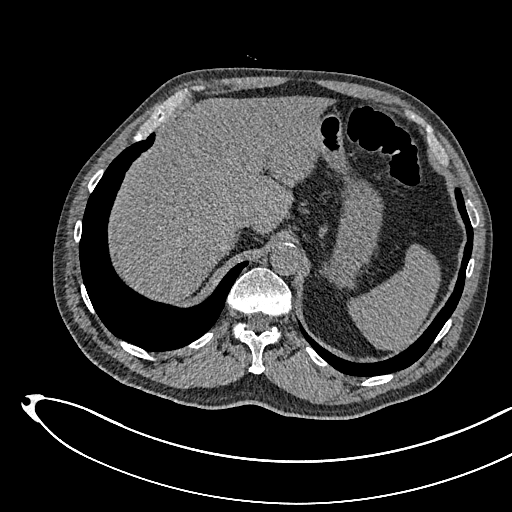
[im 289/347  lung]
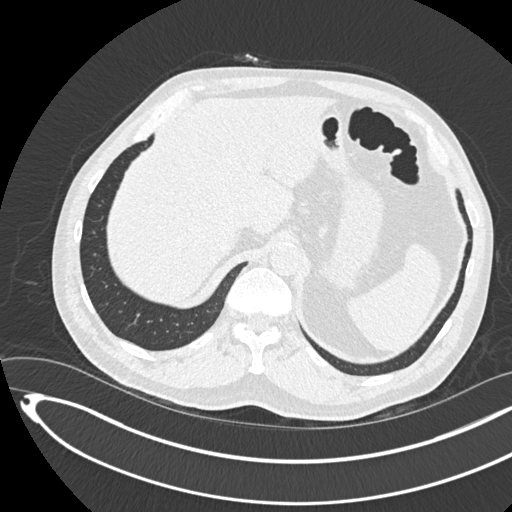

[Series 10: supine colon 3.00 br40 s3 cor supine · coronal · 0.82mm/px · 2 of 139 slices shown, 3 images]
[im 47/139  soft-tissue]
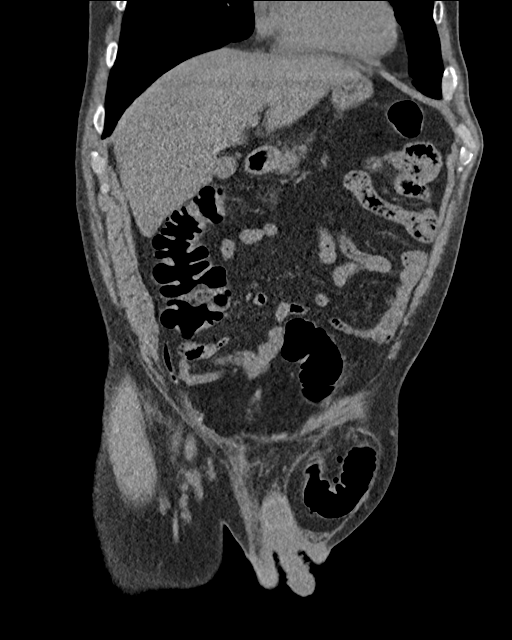
[im 47/139  bone]
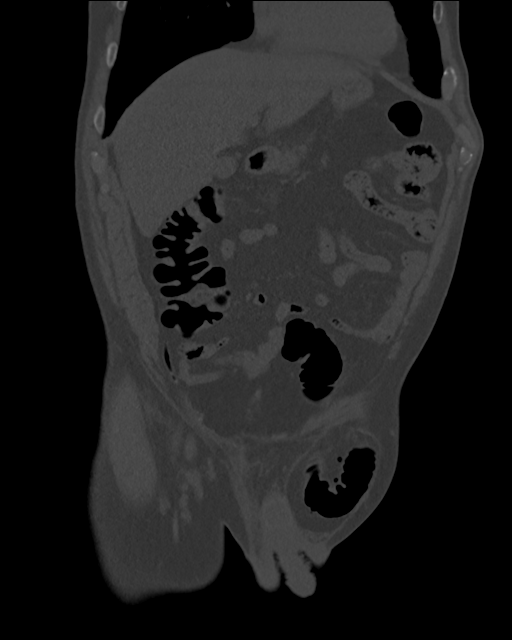
[im 93/139  soft-tissue]
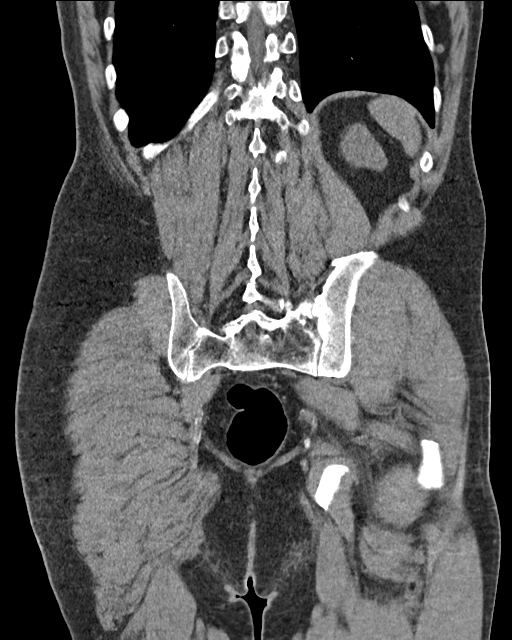

[Series 15: prone colon 1.50 br40 s3 prone thin · axial · 0.81mm/px · z∈[+1342,+1696]mm · 5 of 356 slices shown]
[im 60/356  soft-tissue]
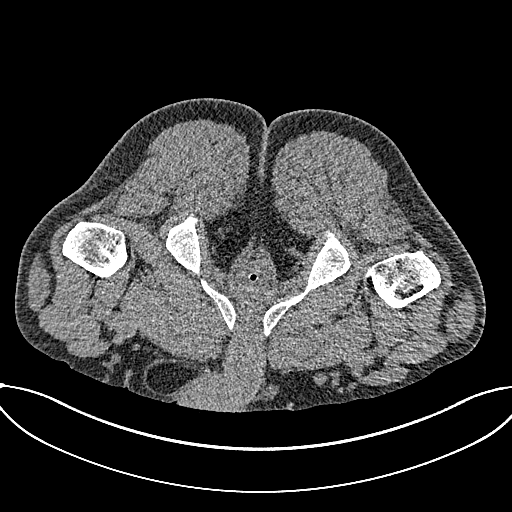
[im 119/356  soft-tissue]
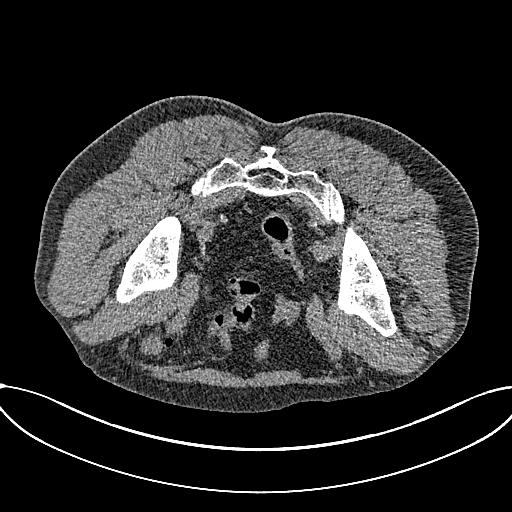
[im 178/356  soft-tissue]
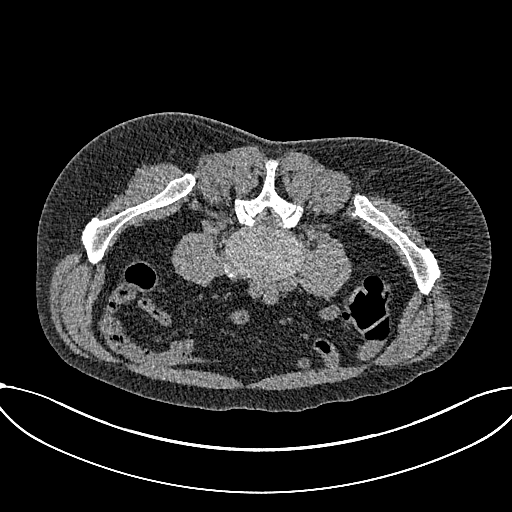
[im 237/356  soft-tissue]
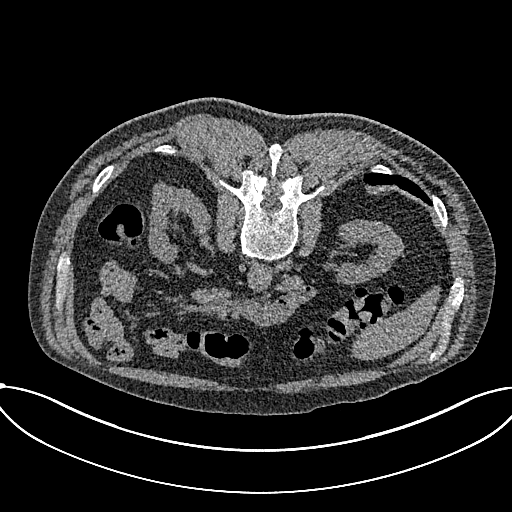
[im 296/356  soft-tissue]
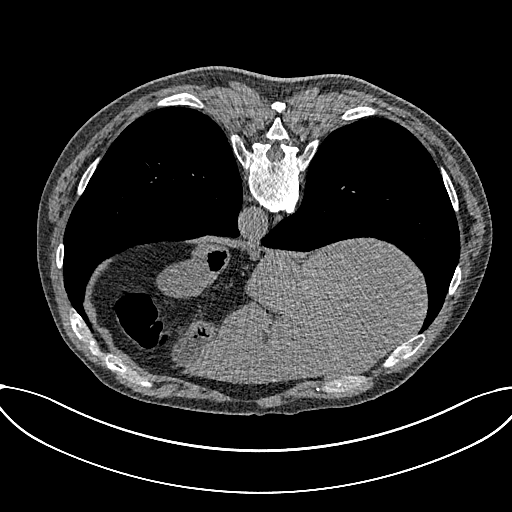

[12 of 46 positions shown; findings below may reference images not displayed]

FINDINGS: VIRTUAL COLONOSCOPY

There is a large left inguinal hernia containing a loop of sigmoid
colon. This segment of colon is not well distended. Under distention
of the sigmoid colon diffusely on prone imaging, better distended on
supine imaging. No fixed polypoid filling defects or annular
constricting lesions. Scattered sigmoid diverticula.

Virtual colonoscopy is not designed to detect diminutive polyps
(i.e., less than or equal to 5 mm), the presence or absence of which
may not affect clinical management.

CT ABDOMEN AND PELVIS WITHOUT CONTRAST

Lower chest: No acute findings

Hepatobiliary: No focal hepatic abnormality. Gallbladder
unremarkable.

Pancreas: No focal abnormality or ductal dilatation.

Spleen: No focal abnormality.  Normal size.

Adrenals/Urinary Tract: Punctate nonobstructing stones in the
kidneys bilaterally. No ureteral stones or hydronephrosis. No renal
or adrenal mass. Urinary bladder unremarkable.

Stomach/Bowel: Stomach and small bowel decompressed, unremarkable.

Vascular/Lymphatic: Scattered aortic atherosclerosis. No evidence of
aneurysm or adenopathy.

Reproductive: Prior hysterectomy.  No adnexal masses.

Other: No free fluid or free air.

Musculoskeletal: No acute bony abnormality.
IMPRESSION: Large left inguinal hernia containing sigmoid colon. Much of the
sigmoid colon including this segment within the hernia is under
distended and not well visualized/evaluated. No evidence of bowel
obstruction.

No fixed polypoid filling defects or annular constricting lesions
visualized.

Scattered sigmoid diverticula.

Bilateral nephrolithiasis.  No hydronephrosis.

Aortic atherosclerosis.

## 2024-05-08 DIAGNOSIS — K4090 Unilateral inguinal hernia, without obstruction or gangrene, not specified as recurrent: Secondary | ICD-10-CM | POA: Diagnosis not present

## 2024-05-08 DIAGNOSIS — I700 Atherosclerosis of aorta: Secondary | ICD-10-CM | POA: Diagnosis not present

## 2024-05-08 DIAGNOSIS — I10 Essential (primary) hypertension: Secondary | ICD-10-CM | POA: Diagnosis not present

## 2024-05-08 DIAGNOSIS — J302 Other seasonal allergic rhinitis: Secondary | ICD-10-CM | POA: Diagnosis not present

## 2024-05-08 DIAGNOSIS — R7303 Prediabetes: Secondary | ICD-10-CM | POA: Diagnosis not present

## 2024-05-08 DIAGNOSIS — N200 Calculus of kidney: Secondary | ICD-10-CM | POA: Diagnosis not present

## 2024-05-08 DIAGNOSIS — Z0000 Encounter for general adult medical examination without abnormal findings: Secondary | ICD-10-CM | POA: Diagnosis not present

## 2024-05-08 DIAGNOSIS — E785 Hyperlipidemia, unspecified: Secondary | ICD-10-CM | POA: Diagnosis not present

## 2024-05-08 DIAGNOSIS — Z125 Encounter for screening for malignant neoplasm of prostate: Secondary | ICD-10-CM | POA: Diagnosis not present

## 2024-05-08 DIAGNOSIS — Z1389 Encounter for screening for other disorder: Secondary | ICD-10-CM | POA: Diagnosis not present

## 2024-08-08 ENCOUNTER — Emergency Department (HOSPITAL_COMMUNITY)
Admission: EM | Admit: 2024-08-08 | Discharge: 2024-08-08 | Disposition: A | Discharge status: Home / Self Care | Attending: Emergency Medicine | Admitting: Emergency Medicine

## 2024-08-08 ENCOUNTER — Emergency Department (HOSPITAL_COMMUNITY)

## 2024-08-08 DIAGNOSIS — M1612 Unilateral primary osteoarthritis, left hip: Secondary | ICD-10-CM | POA: Diagnosis not present

## 2024-08-08 DIAGNOSIS — M79652 Pain in left thigh: Secondary | ICD-10-CM | POA: Diagnosis not present

## 2024-08-08 DIAGNOSIS — S79922A Unspecified injury of left thigh, initial encounter: Secondary | ICD-10-CM | POA: Diagnosis not present

## 2024-08-08 DIAGNOSIS — W108XXA Fall (on) (from) other stairs and steps, initial encounter: Secondary | ICD-10-CM | POA: Diagnosis not present

## 2024-08-08 DIAGNOSIS — S76912A Strain of unspecified muscles, fascia and tendons at thigh level, left thigh, initial encounter: Secondary | ICD-10-CM | POA: Insufficient documentation

## 2024-08-08 DIAGNOSIS — W19XXXA Unspecified fall, initial encounter: Secondary | ICD-10-CM | POA: Diagnosis not present

## 2024-08-08 DIAGNOSIS — S86912A Strain of unspecified muscle(s) and tendon(s) at lower leg level, left leg, initial encounter: Secondary | ICD-10-CM | POA: Diagnosis not present

## 2024-08-08 DIAGNOSIS — W109XXA Fall (on) (from) unspecified stairs and steps, initial encounter: Secondary | ICD-10-CM | POA: Diagnosis not present

## 2024-08-08 DIAGNOSIS — S8992XA Unspecified injury of left lower leg, initial encounter: Secondary | ICD-10-CM | POA: Diagnosis present

## 2024-08-08 DIAGNOSIS — M25552 Pain in left hip: Secondary | ICD-10-CM | POA: Diagnosis not present

## 2024-08-08 DIAGNOSIS — M25462 Effusion, left knee: Secondary | ICD-10-CM | POA: Diagnosis not present

## 2024-08-08 DIAGNOSIS — M25562 Pain in left knee: Secondary | ICD-10-CM | POA: Diagnosis not present

## 2024-08-08 MED ORDER — HYDROCODONE-ACETAMINOPHEN 5-325 MG PO TABS: 1.0000 | ORAL_TABLET | ORAL | 0 refills | Status: AC | PRN

## 2024-08-08 MED ORDER — IBUPROFEN 600 MG PO TABS: 600.0000 mg | ORAL_TABLET | Freq: Four times a day (QID) | ORAL | 0 refills | Status: AC | PRN

## 2024-08-08 MED ORDER — MORPHINE SULFATE (PF) 4 MG/ML IV SOLN
4.0000 mg | Freq: Once | INTRAVENOUS | Status: AC
  Administered 2024-08-08: 4 mg via INTRAVENOUS
  Filled 2024-08-08: qty 1

## 2024-08-08 MED ORDER — ONDANSETRON HCL 4 MG/2ML IJ SOLN
4.0000 mg | Freq: Once | INTRAMUSCULAR | Status: AC
  Administered 2024-08-08: 4 mg via INTRAVENOUS
  Filled 2024-08-08: qty 2

## 2024-08-08 NOTE — ED Provider Notes (Signed)
 [facility] AT [facility] Provider Note   CSN: [id number] Arrival date & time: 08/08/24  1500     Patient presents with: Fall and Knee Injury   Zachary Scott is a 68 y.o. male.   Pt is a 68 yo male with pmhx significant for GERD.  Pt said he slipped going down the stairs and landed on his left knee.  He has not been able to ambulate since then.  He denies any other injuries.  He's  not on thinners.         Prior to Admission medications   Medication Sig Start Date End Date Taking? Authorizing Provider  HYDROcodone-acetaminophen (NORCO/VICODIN) 5-325 MG tablet Take 1 tablet by mouth every 4 (four) hours as needed. 08/08/24  Yes Dean Clarity, MD  ibuprofen  (ADVIL ) 600 MG tablet Take 1 tablet (600 mg total) by mouth every 6 (six) hours as needed. 08/08/24  Yes Dean Clarity, MD  ibuprofen  (ADVIL ,MOTRIN ) 600 MG tablet Take 1 tablet (600 mg total) by mouth 3 (three) times daily after meals. Patient not taking: Reported on 09/28/2018 07/10/13   Carlyle Lenis, MD    Allergies: Codeine    Review of Systems  Musculoskeletal:        Left knee pain  All other systems reviewed and are negative.   Updated Vital Signs BP (!) 175/75 (BP Location: Right Arm)   Pulse 77   Temp 98.1 F (36.7 C) (Oral)   Resp 16   SpO2 100%   Physical Exam Vitals and nursing note reviewed.  Constitutional:      Appearance: Normal appearance.  HENT:     Head: Normocephalic and atraumatic.     Right Ear: External ear normal.     Left Ear: External ear normal.     Nose: Nose normal.     Mouth/Throat:     Mouth: Mucous membranes are moist.     Pharynx: Oropharynx is clear.  Eyes:     Extraocular Movements: Extraocular movements intact.     Conjunctiva/sclera: Conjunctivae normal.     Pupils: Pupils are equal, round, and reactive to light.  Cardiovascular:     Rate and Rhythm: Normal rate and regular rhythm.     Pulses: Normal pulses.     Heart sounds:  Normal heart sounds.  Pulmonary:     Effort: Pulmonary effort is normal.     Breath sounds: Normal breath sounds.  Abdominal:     General: Abdomen is flat. Bowel sounds are normal.     Palpations: Abdomen is soft.  Musculoskeletal:     Cervical back: Normal range of motion and neck supple.       Legs:  Skin:    General: Skin is warm.     Capillary Refill: Capillary refill takes less than 2 seconds.  Neurological:     General: No focal deficit present.     Mental Status: He is alert and oriented to person, place, and time.  Psychiatric:        Mood and Affect: Mood normal.        Behavior: Behavior normal.     (all labs ordered are listed, but only abnormal results are displayed) Labs Reviewed - No data to display  EKG: None  Radiology: CT FEMUR LEFT WO CONTRAST Result Date: 08/08/2024 CLINICAL DATA:  Fall and left hip pain. EXAM: CT OF THE LOWER LEFT EXTREMITY WITHOUT CONTRAST TECHNIQUE: Multidetector CT imaging of the lower left extremity was performed according to the  standard protocol. RADIATION DOSE REDUCTION: This exam was performed according to the departmental dose-optimization program which includes automated exposure control, adjustment of the mA and/or kV according to patient size and/or use of iterative reconstruction technique. COMPARISON:  Left knee radiograph dated 07/31/2024. FINDINGS: Bones/Joint/Cartilage No acute fracture or dislocation. Mild arthritic changes of the left hip. Small left knee suprapatellar effusion. Ligaments Suboptimally assessed by CT. Muscles and Tendons No acute findings. Soft tissues No acute findings. IMPRESSION: 1. No acute fracture or dislocation. 2. Mild arthritic changes of the left hip. 3. Small left knee suprapatellar effusion. Electronically Signed   By: Vanetta Chou M.D.   On: 08/08/2024 17:30   DG Femur Min 2 Views Left Result Date: 08/08/2024 CLINICAL DATA:  Clemens down steps, pain EXAM: LEFT FEMUR 2 VIEWS COMPARISON:  None  Available. FINDINGS: There is no evidence of fracture or other focal bone lesions. Soft tissues are unremarkable. IMPRESSION: Negative. Electronically Signed   By: Luke Bun M.D.   On: 08/08/2024 15:55   DG Knee Left Port Result Date: 08/08/2024 CLINICAL DATA:  Knee pain EXAM: PORTABLE LEFT KNEE - 1-2 VIEW COMPARISON:  None Available. FINDINGS: No definitive fracture or malalignment. Positive for knee effusion. Vascular calcifications. IMPRESSION: No definitive fracture. Positive for knee effusion. Electronically Signed   By: Luke Bun M.D.   On: 08/08/2024 15:54     Procedures   Medications Ordered in the ED  morphine (PF) 4 MG/ML injection 4 mg (4 mg Intravenous Given 08/08/24 1550)  ondansetron (ZOFRAN) injection 4 mg (4 mg Intravenous Given 08/08/24 1551)                                    Medical Decision Making Amount and/or Complexity of Data Reviewed Radiology: ordered.  Risk Prescription drug management.   This patient presents to the ED for concern of fall, this involves an extensive number of treatment options, and is a complaint that carries with it a high risk of complications and morbidity.  The differential diagnosis includes multiple trauma   Co morbidities that complicate the patient evaluation  GERD   Additional history obtained:  Additional history obtained from epic chart review External records from outside source obtained and reviewed including EMS report   Imaging Studies ordered:  I ordered imaging studies including left femur, left knee, ct femur  I independently visualized and interpreted imaging which showed  Left knee XR: No definitive fracture. Positive for knee effusion.  L femur XR: Negative.  CT femur:  No acute fracture or dislocation.  2. Mild arthritic changes of the left hip.  3. Small left knee suprapatellar effusion.   I agree with the radiologist interpretation   Medicines ordered and prescription drug  management:  I ordered medication including morphine/zofran  for sx  Reevaluation of the patient after these medicines showed that the patient improved I have reviewed the patients home medicines and have made adjustments as needed   Test Considered:  ct   Problem List / ED Course:  Left quadricep strain:  ct neg.  Pt given an ace wrap and crutches.  He is to f/u with ortho.  Return if worse.    Reevaluation:  After the interventions noted above, I reevaluated the patient and found that they have :improved   Social Determinants of Health:  Lives at home   Dispostion:  After consideration of the diagnostic results and the patients response  to treatment, I feel that the patent would benefit from discharge with outpatient f/u.       Final diagnoses:  Muscle strain of left thigh, initial encounter    ED Discharge Orders          Ordered    ibuprofen  (ADVIL ) 600 MG tablet  Every 6 hours PRN        08/08/24 1746    HYDROcodone-acetaminophen (NORCO/VICODIN) 5-325 MG tablet  Every 4 hours PRN        08/08/24 1746               Dean Clarity, MD 08/08/24 1748

## 2024-08-08 NOTE — ED Triage Notes (Signed)
 BIBA from home -Missed the last 2 steps and fell down on to left side. No LOC, no thinners. Pt has c/o left knee pain, swelling. 140/86 bp 74 hr 16 rr 98% r/a

## 2024-08-08 NOTE — Progress Notes (Signed)
 Orthopedic Tech Progress Note Patient Details:  Zachary Scott [birth year] [id number]  Ortho Devices Type of Ortho Device: Ace wrap, Crutches Ortho Device/Splint Location: ace to left leg. crutches sized and instructed on use Ortho Device/Splint Interventions: Ordered, Application, Adjustment   Post Interventions Patient Tolerated: Well, Ambulated well Instructions Provided: Care of device, Adjustment of device  Waylan Thom Loving 08/08/2024, 6:00 PM

## 2024-08-10 DIAGNOSIS — M25562 Pain in left knee: Secondary | ICD-10-CM | POA: Diagnosis not present

## 2024-08-10 DIAGNOSIS — M76899 Other specified enthesopathies of unspecified lower limb, excluding foot: Secondary | ICD-10-CM | POA: Diagnosis not present

## 2024-08-10 DIAGNOSIS — G8929 Other chronic pain: Secondary | ICD-10-CM | POA: Diagnosis not present

## 2024-08-17 DIAGNOSIS — M25562 Pain in left knee: Secondary | ICD-10-CM | POA: Diagnosis not present

## 2024-08-17 DIAGNOSIS — S7010XA Contusion of unspecified thigh, initial encounter: Secondary | ICD-10-CM | POA: Diagnosis not present

## 2024-08-17 DIAGNOSIS — M6281 Muscle weakness (generalized): Secondary | ICD-10-CM | POA: Diagnosis not present

## 2024-08-17 DIAGNOSIS — G8929 Other chronic pain: Secondary | ICD-10-CM | POA: Diagnosis not present

## 2024-09-11 DIAGNOSIS — M25562 Pain in left knee: Secondary | ICD-10-CM | POA: Diagnosis not present
